# Patient Record
Sex: Female | Born: 1970 | Race: White | Hispanic: No | Marital: Single | Smoking: Never smoker
Health system: Southern US, Community
[De-identification: ages and names within clinical notes are randomized; demographics above are authoritative.]

## PROBLEM LIST (undated history)

## (undated) HISTORY — PX: CHOLECYSTECTOMY: SHX55

---

## 2015-03-31 ENCOUNTER — Encounter (HOSPITAL_COMMUNITY): Payer: Self-pay | Admitting: *Deleted

## 2015-03-31 ENCOUNTER — Emergency Department (HOSPITAL_COMMUNITY): Payer: Managed Care, Other (non HMO)

## 2015-03-31 ENCOUNTER — Emergency Department (HOSPITAL_COMMUNITY)
Admission: EM | Admit: 2015-03-31 | Discharge: 2015-03-31 | Disposition: A | Discharge status: Home / Self Care | Payer: Managed Care, Other (non HMO) | Attending: Emergency Medicine | Admitting: Emergency Medicine

## 2015-03-31 DIAGNOSIS — S3991XA Unspecified injury of abdomen, initial encounter: Secondary | ICD-10-CM | POA: Diagnosis not present

## 2015-03-31 DIAGNOSIS — R55 Syncope and collapse: Secondary | ICD-10-CM | POA: Diagnosis not present

## 2015-03-31 DIAGNOSIS — S99922A Unspecified injury of left foot, initial encounter: Secondary | ICD-10-CM | POA: Diagnosis not present

## 2015-03-31 DIAGNOSIS — Y9389 Activity, other specified: Secondary | ICD-10-CM | POA: Insufficient documentation

## 2015-03-31 DIAGNOSIS — S8992XA Unspecified injury of left lower leg, initial encounter: Secondary | ICD-10-CM | POA: Insufficient documentation

## 2015-03-31 DIAGNOSIS — S29001A Unspecified injury of muscle and tendon of front wall of thorax, initial encounter: Secondary | ICD-10-CM | POA: Diagnosis present

## 2015-03-31 DIAGNOSIS — Z3202 Encounter for pregnancy test, result negative: Secondary | ICD-10-CM | POA: Insufficient documentation

## 2015-03-31 DIAGNOSIS — Y998 Other external cause status: Secondary | ICD-10-CM | POA: Insufficient documentation

## 2015-03-31 DIAGNOSIS — S99921A Unspecified injury of right foot, initial encounter: Secondary | ICD-10-CM | POA: Insufficient documentation

## 2015-03-31 DIAGNOSIS — Y92410 Unspecified street and highway as the place of occurrence of the external cause: Secondary | ICD-10-CM | POA: Insufficient documentation

## 2015-03-31 DIAGNOSIS — S8991XA Unspecified injury of right lower leg, initial encounter: Secondary | ICD-10-CM | POA: Diagnosis not present

## 2015-03-31 DIAGNOSIS — S2220XA Unspecified fracture of sternum, initial encounter for closed fracture: Secondary | ICD-10-CM | POA: Diagnosis not present

## 2015-03-31 DIAGNOSIS — R52 Pain, unspecified: Secondary | ICD-10-CM

## 2015-03-31 DIAGNOSIS — M79605 Pain in left leg: Secondary | ICD-10-CM

## 2015-03-31 LAB — COMPREHENSIVE METABOLIC PANEL
ALT: 69 U/L — ABNORMAL HIGH (ref 14–54)
ANION GAP: 12 (ref 5–15)
AST: 111 U/L — ABNORMAL HIGH (ref 15–41)
Albumin: 3.8 g/dL (ref 3.5–5.0)
Alkaline Phosphatase: 74 U/L (ref 38–126)
BILIRUBIN TOTAL: 0.6 mg/dL (ref 0.3–1.2)
BUN: 17 mg/dL (ref 6–20)
CO2: 20 mmol/L — ABNORMAL LOW (ref 22–32)
Calcium: 8.7 mg/dL — ABNORMAL LOW (ref 8.9–10.3)
Chloride: 107 mmol/L (ref 101–111)
Creatinine, Ser: 0.85 mg/dL (ref 0.44–1.00)
GFR calc Af Amer: >60 mL/min (ref >60)
Glucose, Bld: 145 mg/dL — ABNORMAL HIGH (ref 65–99)
POTASSIUM: 3.2 mmol/L — AB (ref 3.5–5.1)
Sodium: 139 mmol/L (ref 135–145)
TOTAL PROTEIN: 6.4 g/dL — AB (ref 6.5–8.1)

## 2015-03-31 LAB — ETHANOL

## 2015-03-31 LAB — CBC
HEMATOCRIT: 33 % — AB (ref 36.0–46.0)
HEMOGLOBIN: 10.7 g/dL — AB (ref 12.0–15.0)
MCH: 26.5 pg (ref 26.0–34.0)
MCHC: 32.4 g/dL (ref 30.0–36.0)
MCV: 81.7 fL (ref 78.0–100.0)
Platelets: 226 10*3/uL (ref 150–400)
RBC: 4.04 MIL/uL (ref 3.87–5.11)
RDW: 14.4 % (ref 11.5–15.5)
WBC: 15.2 10*3/uL — AB (ref 4.0–10.5)

## 2015-03-31 LAB — PROTIME-INR
INR: 1.02 (ref 0.00–1.49)
PROTHROMBIN TIME: 13.6 s (ref 11.6–15.2)

## 2015-03-31 LAB — I-STAT BETA HCG BLOOD, ED (MC, WL, AP ONLY)

## 2015-03-31 LAB — CDS SEROLOGY

## 2015-03-31 MED ORDER — HYDROCODONE-ACETAMINOPHEN 5-325 MG PO TABS: 2.0000 | ORAL_TABLET | ORAL | Status: DC | PRN

## 2015-03-31 MED ORDER — MORPHINE SULFATE (PF) 4 MG/ML IV SOLN
6.0000 mg | Freq: Once | INTRAVENOUS | Status: DC
  Filled 2015-03-31: qty 2

## 2015-03-31 MED ORDER — IOHEXOL 300 MG/ML  SOLN: 80.0000 mL | Freq: Once | INTRAMUSCULAR | Status: DC | PRN

## 2015-03-31 MED ORDER — SODIUM CHLORIDE 0.9 % IV BOLUS (SEPSIS)
500.0000 mL | Freq: Once | INTRAVENOUS | Status: AC
  Administered 2015-03-31: 500 mL via INTRAVENOUS

## 2015-03-31 MED ORDER — IBUPROFEN 800 MG PO TABS
800.0000 mg | ORAL_TABLET | Freq: Once | ORAL | Status: AC
  Administered 2015-03-31: 800 mg via ORAL
  Filled 2015-03-31: qty 1

## 2015-03-31 MED ORDER — SODIUM CHLORIDE 0.9 % IV BOLUS (SEPSIS)
1000.0000 mL | Freq: Once | INTRAVENOUS | Status: AC
  Administered 2015-03-31: 1000 mL via INTRAVENOUS

## 2015-03-31 NOTE — ED Notes (Signed)
Pt to CT

## 2015-03-31 NOTE — ED Notes (Signed)
Pt unable to use bedpan 

## 2015-03-31 NOTE — ED Notes (Signed)
Pt given paper scrubs and socks, Pt ambulatory around room with pain in chest. Steady gait noted. Dr.Zavitz aware pt is ready for discharge

## 2015-03-31 NOTE — ED Notes (Signed)
Pt requesting update, Dr.Zavitz at bedside
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
## 2015-03-31 NOTE — ED Notes (Signed)
Offered pain medication, pt states she feels ok right now, pt will inform staff if pain worsens

## 2015-03-31 NOTE — Progress Notes (Signed)
RT gave patient Incentive Spirometer and provided education. Patient was able to perform 5 times ranging from to . RT encouraged patient to continue to use as she is able.

## 2015-03-31 NOTE — ED Notes (Signed)
Pt given crackers and water. 

## 2015-03-31 NOTE — ED Notes (Signed)
Incentive spirometry education provided by RT

## 2015-03-31 NOTE — Discharge Instructions (Signed)
If you were given medicines take as directed.  If you are on coumadin or contraceptives realize their levels and effectiveness is altered by many different medicines.  If you have any reaction (rash, tongues swelling, other) to the medicines stop taking and see a physician.   Use ice, ibuprofen and tylenol for pain. Use spirometry regularly. For severe pain take norco or vicodin however realize they have the potential for addiction and it can make you sleepy and has tylenol in it.  No operating machinery while taking.  If your blood pressure was elevated in the ER make sure you follow up for management with a primary doctor or return for chest pain, shortness of breath or stroke symptoms.  Please follow up as directed and return to the ER or see a physician for new or worsening symptoms.  Thank you. Filed Vitals:   03/31/15 2134 03/31/15 2145 03/31/15 2200 03/31/15 2230  BP: 127/75 108/72 117/71 120/58  Pulse: 118 117 118 113  Temp:      TempSrc:      Resp: Height:      Weight:      SpO2:  100% 99% 100%

## 2015-04-01 LAB — SAMPLE TO BLOOD BANK

## 2015-04-02 MED ORDER — IOHEXOL 300 MG/ML  SOLN
80.0000 mL | Freq: Once | INTRAMUSCULAR | Status: AC | PRN
  Administered 2015-03-31: 80 mL via INTRAVENOUS

## 2015-09-16 DIAGNOSIS — Z021 Encounter for pre-employment examination: Secondary | ICD-10-CM | POA: Insufficient documentation
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
## 2017-12-22 DIAGNOSIS — K219 Gastro-esophageal reflux disease without esophagitis: Secondary | ICD-10-CM | POA: Insufficient documentation

## 2019-06-30 ENCOUNTER — Other Ambulatory Visit: Payer: Self-pay

## 2019-06-30 ENCOUNTER — Other Ambulatory Visit: Payer: Self-pay | Admitting: Podiatry

## 2019-06-30 ENCOUNTER — Encounter: Payer: Self-pay | Admitting: Podiatry

## 2019-06-30 ENCOUNTER — Ambulatory Visit (INDEPENDENT_AMBULATORY_CARE_PROVIDER_SITE_OTHER): Payer: 59

## 2019-06-30 ENCOUNTER — Ambulatory Visit (INDEPENDENT_AMBULATORY_CARE_PROVIDER_SITE_OTHER): Payer: 59 | Admitting: Podiatry

## 2019-06-30 VITALS — BP 119/91 | HR 72 | Resp 16

## 2019-06-30 DIAGNOSIS — M722 Plantar fascial fibromatosis: Secondary | ICD-10-CM | POA: Diagnosis not present

## 2019-06-30 DIAGNOSIS — M2011 Hallux valgus (acquired), right foot: Secondary | ICD-10-CM | POA: Diagnosis not present

## 2019-06-30 DIAGNOSIS — M778 Other enthesopathies, not elsewhere classified: Secondary | ICD-10-CM

## 2019-07-03 NOTE — Progress Notes (Signed)
Subjective:   Patient ID: Linda Steele, female   DOB: 49 y.o.   MRN: [id number]   HPI Patient presents with 2 problems with 1 being knots in the left arch that are not sore but she is concerned because the foot feels heavy and they have grown and she is worried about what they could be and states it is only been recent that she seen this and on the right she is got bunion deformity making shoe gear uncomfortable and states she knows at 1 point future she needs to get it fixed   Review of Systems  All other systems reviewed and are negative.       Objective:  Physical Exam Vitals and nursing note reviewed.  Constitutional:      Appearance: She is well-developed.  Pulmonary:     Effort: Pulmonary effort is normal.  Musculoskeletal:        General: Normal range of motion.  Skin:    General: Skin is warm.  Neurological:     Mental Status: She is alert.     Neurovascular status intact muscle strength adequate range of motion within normal limits.  Patient is noted to have several small lesions measuring about 7 x 7 mm within the left arch and appear to be within the plantar fascia but also may have a small cystic element as they are very new and on the right has moderate structural bunion with redness and discomfort     Assessment:  Probability for plantar fibroma left foot there could be some cystic portion as there is so new along with bunion deformity right     Plan:  H&P reviewed both conditions educating her on plantar fibroma and the possibility for excision in future.  She does not want to go this she would like to try to shrink them and at this point organ to try steroid injection underneath them to see if we can shrink them as they are so new.  I then discussed possibility for excision if they were to grow in size change color or any other pathology were to occur.  For the right foot I reviewed distal osteotomy which may be necessary and patient will be seen back for [place] to  discuss in greater detail  X-rays indicate structural malalignment with no indications of calcification left with bunion deformity right over left
